# Patient Record
Sex: Female | Born: 1995 | Race: White | Hispanic: No | Marital: Single | Smoking: Never smoker
Health system: Southern US, Community
[De-identification: ages and names within clinical notes are randomized; demographics above are authoritative.]

---
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
## 2016-02-15 DIAGNOSIS — F411 Generalized anxiety disorder: Secondary | ICD-10-CM | POA: Insufficient documentation

## 2016-02-15 DIAGNOSIS — N941 Unspecified dyspareunia: Secondary | ICD-10-CM | POA: Insufficient documentation

## 2016-03-28 DIAGNOSIS — R1013 Epigastric pain: Secondary | ICD-10-CM | POA: Insufficient documentation

## 2016-04-26 DIAGNOSIS — N94819 Vulvodynia, unspecified: Secondary | ICD-10-CM | POA: Insufficient documentation

## 2016-04-26 DIAGNOSIS — M6289 Other specified disorders of muscle: Secondary | ICD-10-CM | POA: Insufficient documentation

## 2016-12-30 DIAGNOSIS — S76011A Strain of muscle, fascia and tendon of right hip, initial encounter: Secondary | ICD-10-CM | POA: Insufficient documentation

## 2017-04-07 ENCOUNTER — Emergency Department
Admission: EM | Admit: 2017-04-07 | Discharge: 2017-04-07 | Disposition: A | Discharge status: Home / Self Care | Payer: BLUE CROSS/BLUE SHIELD | Attending: Emergency Medicine | Admitting: Emergency Medicine

## 2017-04-07 ENCOUNTER — Encounter: Payer: Self-pay | Admitting: Emergency Medicine

## 2017-04-07 DIAGNOSIS — M62830 Muscle spasm of back: Secondary | ICD-10-CM | POA: Diagnosis not present

## 2017-04-07 DIAGNOSIS — M549 Dorsalgia, unspecified: Secondary | ICD-10-CM | POA: Diagnosis present

## 2017-04-07 MED ORDER — ORPHENADRINE CITRATE 30 MG/ML IJ SOLN
60.0000 mg | Freq: Once | INTRAMUSCULAR | Status: AC
  Administered 2017-04-07: 60 mg via INTRAMUSCULAR
  Filled 2017-04-07: qty 2

## 2017-04-07 MED ORDER — KETOROLAC TROMETHAMINE 30 MG/ML IJ SOLN
30.0000 mg | Freq: Once | INTRAMUSCULAR | Status: AC
  Administered 2017-04-07: 30 mg via INTRAMUSCULAR
  Filled 2017-04-07: qty 1

## 2017-04-07 MED ORDER — METHOCARBAMOL 500 MG PO TABS: 500.0000 mg | ORAL_TABLET | Freq: Four times a day (QID) | ORAL | 0 refills | Status: DC

## 2017-04-07 MED ORDER — MELOXICAM 15 MG PO TABS: 15.0000 mg | ORAL_TABLET | Freq: Every day | ORAL | 0 refills | Status: DC

## 2017-04-07 NOTE — ED Triage Notes (Signed)
Pt c/o upper back pain mostly on right side.   Started a few hours ago.  Has had back problems in past.  Reports pain is worse with moving, mom had to help put clothes on. Respirations unlabored. Ambulatory to triage without difficulty.

## 2017-04-07 NOTE — ED Provider Notes (Signed)
[facility] Emergency Department Provider Note  ____________________________________________  Time seen: Approximately 5:52 PM  I have reviewed the triage vital signs and the nursing notes.   HISTORY  Chief Complaint Back Pain    HPI ASHLLEY [facility] is a 21 y.o. female who presents emergency department complaining of right upper back pain. Patient reports that she got out of the shower, way down when she felt a sharp pain to the right upper back. Patient reports that is a tight/pulling/burning sensation to the right upper back and shoulder region. No injury or trauma. Patient denies any radicular symptoms. Patient denies any chest pain, shortness of breath, abdominal pain, nausea or vomiting. No medications prior to arrival.   History reviewed. No pertinent past medical history.  There are no active problems to display for this patient.   History reviewed. No pertinent surgical history.  Prior to Admission medications   Medication Sig Start Date End Date Taking? Authorizing Provider  meloxicam (MOBIC) 15 MG tablet Take 1 tablet (15 mg total) by mouth daily. 04/07/17   Cuthriell, Delorise Royals, PA-C  methocarbamol (ROBAXIN) 500 MG tablet Take 1 tablet (500 mg total) by mouth 4 (four) times daily. 04/07/17   Cuthriell, Delorise Royals, PA-C    Allergies Amoxicillin  History reviewed. No pertinent family history.  Social History Social History  Substance Use Topics  . Smoking status: Never Smoker  . Smokeless tobacco: Never Used  . Alcohol use No     Review of Systems  Constitutional: No fever/chills Cardiovascular: no chest pain. Respiratory: no cough. No SOB. Musculoskeletal: Positive for right upper back pain Skin: Negative for rash, abrasions, lacerations, ecchymosis. Neurological: Negative for headaches, focal weakness or numbness. 10-point ROS otherwise negative.  ____________________________________________   PHYSICAL EXAM:  VITAL  SIGNS: ED Triage Vitals  Enc Vitals Group     BP 04/07/17 1611 (!) 148/95     Pulse Rate 04/07/17 1610 84     Resp 04/07/17 1610 14     Temp --      Temp src --      SpO2 04/07/17 1610 99 %     Weight 04/07/17 1610 120 lb (54.4 kg)     Height 04/07/17 1610 5\' 1"  (1.549 m)     Head Circumference --      Peak Flow --      Pain Score 04/07/17 1610 10     Pain Loc --      Pain Edu? --      Excl. in [place]? --      Constitutional: Alert and oriented. Well appearing and in no acute distress. Eyes: Conjunctivae are normal. PERRL. EOMI. Head: Atraumatic. Neck: No stridor.  No cervical spine tenderness to palpation.  Cardiovascular: Normal rate, regular rhythm. Normal S1 and S2.  Good peripheral circulation. Respiratory: Normal respiratory effort without tachypnea or retractions. Lungs CTAB. Good air entry to the bases with no decreased or absent breath sounds. Gastrointestinal: Bowel sounds 4 quadrants. Soft and nontender to palpation. No guarding or rigidity. No palpable masses. No distention. No CVA tenderness. Musculoskeletal: Full range of motion to all extremities. No gross deformities appreciated. Patient is diffusely tender to palpation over the right-sided latissimus dorsi muscle with palpable spasms. No other tenderness to palpation. No palpable abnormality. Full range of motion bilateral shoulders. Radial pulse intact bilateral upper extremities. Sensation intact and equal bilateral upper extremities. Neurologic:  Normal speech and language. No gross focal neurologic deficits are appreciated.  Skin:  Skin is  warm, dry and intact. No rash noted. Psychiatric: Mood and affect are normal. Speech and behavior are normal. Patient exhibits appropriate insight and judgement.   ____________________________________________   LABS (all labs ordered are listed, but only abnormal results are displayed)  Labs Reviewed - No data to  display ____________________________________________  EKG   ____________________________________________  RADIOLOGY   No results found.  ____________________________________________    PROCEDURES  Procedure(s) performed:    Procedures    Medications  ketorolac (TORADOL) 30 MG/ML injection 30 mg (not administered)  orphenadrine (NORFLEX) injection 60 mg (not administered)     ____________________________________________   INITIAL IMPRESSION / ASSESSMENT AND PLAN / ED COURSE  Pertinent labs & imaging results that were available during my care of the patient were reviewed by me and considered in my medical decision making (see chart for details).  Review of the [place] CSRS was performed in accordance of the [facility] prior to dispensing any controlled drugs.     Patient's diagnosis is consistent with muscle spasm of the upper back. No injury concerning for underlying fracture. No imaging or labs deemed necessary at this time. Patient is given Toradol muscle relaxer injection in the emergency department.. Patient will be discharged home with prescriptions for anti-inflammatory and muscle relaxer. Patient is to follow up with primary care as needed or otherwise directed. Patient is given ED precautions to return to the ED for any worsening or new symptoms.     ____________________________________________  FINAL CLINICAL IMPRESSION(S) / ED DIAGNOSES  Final diagnoses:  Muscle spasm of back      NEW MEDICATIONS STARTED DURING THIS VISIT:  New Prescriptions   MELOXICAM (MOBIC) 15 MG TABLET    Take 1 tablet (15 mg total) by mouth daily.   METHOCARBAMOL (ROBAXIN) 500 MG TABLET    Take 1 tablet (500 mg total) by mouth 4 (four) times daily.        This chart was dictated using voice recognition software/Dragon. Despite best efforts to proofread, errors can occur which can change the meaning. Any change was purely unintentional.    Racheal Patches,  PA-C 04/07/17 1817    Governor Rooks, MD 04/08/17 1524

## 2017-04-07 NOTE — ED Notes (Signed)
No signs or symptoms of medication reaction.

## 2017-12-24 DIAGNOSIS — J302 Other seasonal allergic rhinitis: Secondary | ICD-10-CM | POA: Insufficient documentation

## 2018-09-15 DIAGNOSIS — Z111 Encounter for screening for respiratory tuberculosis: Secondary | ICD-10-CM | POA: Insufficient documentation

## 2019-01-31 DIAGNOSIS — R030 Elevated blood-pressure reading, without diagnosis of hypertension: Secondary | ICD-10-CM | POA: Insufficient documentation

## 2019-02-10 DIAGNOSIS — R000 Tachycardia, unspecified: Secondary | ICD-10-CM | POA: Insufficient documentation

## 2019-06-09 ENCOUNTER — Emergency Department
Admission: EM | Admit: 2019-06-09 | Discharge: 2019-06-09 | Disposition: A | Discharge status: Home / Self Care | Payer: BC Managed Care – PPO | Attending: Emergency Medicine | Admitting: Emergency Medicine

## 2019-06-09 ENCOUNTER — Other Ambulatory Visit: Payer: Self-pay

## 2019-06-09 DIAGNOSIS — R112 Nausea with vomiting, unspecified: Secondary | ICD-10-CM

## 2019-06-09 LAB — COMPREHENSIVE METABOLIC PANEL
ALT: 10 U/L (ref 0–44)
AST: 17 U/L (ref 15–41)
Albumin: 3.8 g/dL (ref 3.5–5.0)
Alkaline Phosphatase: 53 U/L (ref 38–126)
Anion gap: 8 (ref 5–15)
BUN: 10 mg/dL (ref 6–20)
CO2: 25 mmol/L (ref 22–32)
Calcium: 9.1 mg/dL (ref 8.9–10.3)
Chloride: 104 mmol/L (ref 98–111)
Creatinine, Ser: 0.8 mg/dL (ref 0.44–1.00)
GFR calc Af Amer: >60 mL/min (ref >60)
GFR calc non Af Amer: >60 mL/min (ref >60)
Glucose, Bld: 106 mg/dL — ABNORMAL HIGH (ref 70–99)
Potassium: 4.2 mmol/L (ref 3.5–5.1)
Sodium: 137 mmol/L (ref 135–145)
Total Bilirubin: 0.4 mg/dL (ref 0.3–1.2)
Total Protein: 7.3 g/dL (ref 6.5–8.1)

## 2019-06-09 LAB — CBC
HCT: 40 % (ref 36.0–46.0)
Hemoglobin: 13.5 g/dL (ref 12.0–15.0)
MCH: 27 pg (ref 26.0–34.0)
MCHC: 33.8 g/dL (ref 30.0–36.0)
MCV: 80 fL (ref 80.0–100.0)
Platelets: 299 10*3/uL (ref 150–400)
RBC: 5 MIL/uL (ref 3.87–5.11)
RDW: 13.6 % (ref 11.5–15.5)
WBC: 9.5 10*3/uL (ref 4.0–10.5)
nRBC: 0 % (ref 0.0–0.2)

## 2019-06-09 LAB — HCG, QUANTITATIVE, PREGNANCY: hCG, Beta Chain, Quant, S: <1 m[IU]/mL (ref <5)

## 2019-06-09 LAB — LIPASE, BLOOD: Lipase: 22 U/L (ref 11–51)

## 2019-06-09 MED ORDER — SODIUM CHLORIDE 0.9 % IV BOLUS
1000.0000 mL | Freq: Once | INTRAVENOUS | Status: AC
  Administered 2019-06-09: 1000 mL via INTRAVENOUS

## 2019-06-09 MED ORDER — PROMETHAZINE HCL 25 MG PO TABS: 25.0000 mg | ORAL_TABLET | Freq: Four times a day (QID) | ORAL | 0 refills | Status: DC | PRN

## 2019-06-09 MED ORDER — ONDANSETRON HCL 4 MG/2ML IJ SOLN
4.0000 mg | Freq: Once | INTRAMUSCULAR | Status: AC
  Administered 2019-06-09: 07:00:00 4 mg via INTRAVENOUS
  Filled 2019-06-09: qty 2

## 2019-06-09 MED ORDER — PROCHLORPERAZINE EDISYLATE 10 MG/2ML IJ SOLN
10.0000 mg | Freq: Once | INTRAMUSCULAR | Status: AC
  Administered 2019-06-09: 10 mg via INTRAVENOUS
  Filled 2019-06-09: qty 2

## 2019-06-09 MED ORDER — DICYCLOMINE HCL 10 MG PO CAPS
20.0000 mg | ORAL_CAPSULE | Freq: Once | ORAL | Status: AC
  Administered 2019-06-09: 20 mg via ORAL
  Filled 2019-06-09: qty 2

## 2019-06-09 NOTE — ED Notes (Signed)
Pt laying across chairs covered up completely with blanket; awake; answered when I went over to see if she was okay; waiting patiently for treatment room

## 2019-06-09 NOTE — ED Triage Notes (Signed)
Patient reports nausea and vomiting for approximately a week.  Patient also reports having high blood pressure.

## 2019-06-09 NOTE — ED Provider Notes (Signed)
-----------------------------------------   12:08 PM on 06/09/2019 -----------------------------------------  Patient care assumed from Dr. Archie Balboa.  Patient's nausea is better controlled at this time.  No further vomiting.  Has drinking water and ginger ale.  We will discharge home with Phenergan, have the patient follow-up with her doctor.  I discussed return precautions with the patient.   Harvest Dark, MD 06/09/19 1208

## 2019-06-09 NOTE — ED Provider Notes (Signed)
[facility] Emergency Department Provider Note  ____________________________________________   I have reviewed the triage vital signs and the nursing notes.   HISTORY  Chief Complaint Emesis   History limited by: Not Limited   HPI [facility] is a 23 y.o. female who presents to the emergency department today because of concerns for nausea and emesis.  The symptoms have been present for the past week.  She denies any bloody vomiting.  Denies any associated abdominal pain or diarrhea.  Patient had similar symptoms in the past states that she was at one point treated for H. pylori.  Records reviewed. Per medical record review patient has a history of white coat syndrome  No past medical history on file.  There are no active problems to display for this patient.   No past surgical history on file.  Prior to Admission medications   Medication Sig Start Date End Date Taking? Authorizing Provider  meloxicam (MOBIC) 15 MG tablet Take 1 tablet (15 mg total) by mouth daily. 04/07/17   Cuthriell, Delorise Royals, PA-C  methocarbamol (ROBAXIN) 500 MG tablet Take 1 tablet (500 mg total) by mouth 4 (four) times daily. 04/07/17   Cuthriell, Delorise Royals, PA-C    Allergies Amoxicillin  No family history on file.  Social History Social History   Tobacco Use  . Smoking status: Never Smoker  . Smokeless tobacco: Never Used  Substance Use Topics  . Alcohol use: No  . Drug use: No    Review of Systems Constitutional: No fever/chills Eyes: No visual changes. ENT: No sore throat. Cardiovascular: Denies chest pain. Respiratory: Denies shortness of breath. Gastrointestinal: No abdominal pain.  Positive for nausea and vomiting.  Genitourinary: Negative for dysuria. Musculoskeletal: Negative for back pain. Skin: Negative for rash. Neurological: Negative for headaches, focal weakness or numbness.  ____________________________________________   PHYSICAL  EXAM:  VITAL SIGNS: ED Triage Vitals  Enc Vitals Group     BP 06/09/19 0230 (!) 152/103     Pulse Rate 06/09/19 0230 73     Resp 06/09/19 0230 16     Temp 06/09/19 0230 98.3 F (36.8 C)     Temp Source 06/09/19 0230 Oral     SpO2 06/09/19 0230 99 %     Weight 06/09/19 0229 133 lb (60.3 kg)     Height 06/09/19 0229 5\' 1"  (1.549 m)     Head Circumference --      Peak Flow --      Pain Score 06/09/19 0229 0   Constitutional: Alert and oriented.  Eyes: Conjunctivae are normal.  ENT      Head: Normocephalic and atraumatic.      Nose: No congestion/rhinnorhea.      Mouth/Throat: Mucous membranes are moist.      Neck: No stridor. Hematological/Lymphatic/Immunilogical: No cervical lymphadenopathy. Cardiovascular: Normal rate, regular rhythm.  No murmurs, rubs, or gallops.  Respiratory: Normal respiratory effort without tachypnea nor retractions. Breath sounds are clear and equal bilaterally. No wheezes/rales/rhonchi. Gastrointestinal: Soft and non tender. No rebound. No guarding.  Genitourinary: Deferred Musculoskeletal: Normal range of motion in all extremities. No lower extremity edema. Neurologic:  Normal speech and language. No gross focal neurologic deficits are appreciated.  Skin:  Skin is warm, dry and intact. No rash noted. Psychiatric: Mood and affect are normal. Speech and behavior are normal. Patient exhibits appropriate insight and judgment.  ____________________________________________    LABS (pertinent positives/negatives)  Lipase 22 CBC wbc 9.5, hgb 13.5, plt 299 CMP wnl except  glu 106  ____________________________________________   EKG  None  ____________________________________________    RADIOLOGY  None  ____________________________________________   PROCEDURES  Procedures  ____________________________________________   INITIAL IMPRESSION / ASSESSMENT AND PLAN / ED COURSE  Pertinent labs & imaging results that were available during my  care of the patient were reviewed by me and considered in my medical decision making (see chart for details).   Patient presented to the emergency department because of concern for nausea and vomiting. Patients blood work without concerning electrolyte abnormality. Will give IV fluids and nausea medication and reassess.   ____________________________________________   FINAL CLINICAL IMPRESSION(S) / ED DIAGNOSES  Final diagnoses:  Nausea and vomiting, intractability of vomiting not specified, unspecified vomiting type     Note: This dictation was prepared with Dragon dictation. Any transcriptional errors that result from this process are unintentional     Nance Pear, MD 06/09/19 [phone]

## 2019-06-09 NOTE — ED Notes (Signed)
Resumed care from [place], [place]. Pt states that she is still nauseous; denies pain. Pt requests crackers. Will check with MD regarding PO challenge; 800 cc NS infused at this time. NAD noted.

## 2020-08-21 HISTORY — PX: GALLBLADDER SURGERY: SHX652

## 2021-08-26 DIAGNOSIS — I160 Hypertensive urgency: Secondary | ICD-10-CM | POA: Insufficient documentation

## 2022-05-26 ENCOUNTER — Emergency Department: Payer: BC Managed Care – PPO

## 2022-05-26 ENCOUNTER — Emergency Department
Admission: EM | Admit: 2022-05-26 | Discharge: 2022-05-27 | Disposition: A | Discharge status: Home / Self Care | Payer: BC Managed Care – PPO | Attending: Emergency Medicine | Admitting: Emergency Medicine

## 2022-05-26 ENCOUNTER — Encounter: Payer: Self-pay | Admitting: Emergency Medicine

## 2022-05-26 DIAGNOSIS — F10920 Alcohol use, unspecified with intoxication, uncomplicated: Secondary | ICD-10-CM | POA: Insufficient documentation

## 2022-05-26 DIAGNOSIS — E876 Hypokalemia: Secondary | ICD-10-CM | POA: Diagnosis not present

## 2022-05-26 DIAGNOSIS — Y908 Blood alcohol level of 240 mg/100 ml or more: Secondary | ICD-10-CM | POA: Diagnosis not present

## 2022-05-26 DIAGNOSIS — I10 Essential (primary) hypertension: Secondary | ICD-10-CM | POA: Diagnosis not present

## 2022-05-26 DIAGNOSIS — R569 Unspecified convulsions: Secondary | ICD-10-CM | POA: Diagnosis present

## 2022-05-26 LAB — COMPREHENSIVE METABOLIC PANEL
ALT: 18 U/L (ref 0–44)
AST: 25 U/L (ref 15–41)
Albumin: 4.2 g/dL (ref 3.5–5.0)
Alkaline Phosphatase: 93 U/L (ref 38–126)
Anion gap: 9 (ref 5–15)
BUN: 11 mg/dL (ref 6–20)
CO2: 24 mmol/L (ref 22–32)
Calcium: 8.8 mg/dL — ABNORMAL LOW (ref 8.9–10.3)
Chloride: 106 mmol/L (ref 98–111)
Creatinine, Ser: 1.1 mg/dL — ABNORMAL HIGH (ref 0.44–1.00)
GFR, Estimated: >60 mL/min (ref >60)
Glucose, Bld: 88 mg/dL (ref 70–99)
Potassium: 3.1 mmol/L — ABNORMAL LOW (ref 3.5–5.1)
Sodium: 139 mmol/L (ref 135–145)
Total Bilirubin: 0.4 mg/dL (ref 0.3–1.2)
Total Protein: 7.7 g/dL (ref 6.5–8.1)

## 2022-05-26 LAB — CBC
HCT: 38.4 % (ref 36.0–46.0)
Hemoglobin: 12.7 g/dL (ref 12.0–15.0)
MCH: 25.4 pg — ABNORMAL LOW (ref 26.0–34.0)
MCHC: 33.1 g/dL (ref 30.0–36.0)
MCV: 76.8 fL — ABNORMAL LOW (ref 80.0–100.0)
Platelets: 319 10*3/uL (ref 150–400)
RBC: 5 MIL/uL (ref 3.87–5.11)
RDW: 13.5 % (ref 11.5–15.5)
WBC: 10.2 10*3/uL (ref 4.0–10.5)
nRBC: 0 % (ref 0.0–0.2)

## 2022-05-26 MED ORDER — SODIUM CHLORIDE 0.9 % IV BOLUS
1000.0000 mL | Freq: Once | INTRAVENOUS | Status: AC
  Administered 2022-05-26: 1000 mL via INTRAVENOUS

## 2022-05-26 NOTE — ED Provider Notes (Signed)
[facility] Provider Note    Event Date/Time   First MD Initiated Contact with Patient 05/26/22 2307     (approximate)   History   Alcohol Intoxication   HPI  Level V caveat: Limited by decreased LOC  [facility] is a 26 y.o. female brought to the ED via EMS from [facility] for witnessed seizure-like activity by bystanders in the bathroom.  Reportedly on scene patient admitted to fentanyl use along with heavy amount of alcohol.  Rest of history is limited secondary to patient's decreased LOC.     Past Medical History  Hypertension Sensorineural hearing loss   Active Problem List  There are no problems to display for this patient.    Past Surgical History  History reviewed. No pertinent surgical history.   Home Medications   Prior to Admission medications   Medication Sig Start Date End Date Taking? Authorizing Provider  meloxicam (MOBIC) 15 MG tablet Take 1 tablet (15 mg total) by mouth daily. 04/07/17   Cuthriell, Charline Bills, PA-C  methocarbamol (ROBAXIN) 500 MG tablet Take 1 tablet (500 mg total) by mouth 4 (four) times daily. 04/07/17   Cuthriell, Charline Bills, PA-C  promethazine (PHENERGAN) 25 MG tablet Take 1 tablet (25 mg total) by mouth every 6 (six) hours as needed for nausea or vomiting. 06/09/19   Harvest Dark, MD     Allergies  Amoxicillin   Family History  History reviewed. No pertinent family history.   Physical Exam  Triage Vital Signs: ED Triage Vitals  Enc Vitals Group     BP 05/26/22 2254 102/66     Pulse Rate 05/26/22 2254 60     Resp 05/26/22 2254 16     Temp 05/26/22 2257 98 F (36.7 C)     Temp Source 05/26/22 2257 Axillary     SpO2 05/26/22 2254 100 %     Weight 05/26/22 2255 136 lb 11 oz (62 kg)     Height 05/26/22 2255 5\' 1"  (1.549 m)     Head Circumference --      Peak Flow --      Pain Score --      Pain Loc --      Pain Edu? --      Excl. in [place]? --     Updated Vital Signs: BP (!)  98/50   Pulse 76   Temp 98 F (36.7 C) (Axillary)   Resp 18   Ht 5\' 1"  (1.549 m)   Wt 62 kg   LMP  (LMP Unknown)   SpO2 100%   BMI 25.83 kg/m    General: Asleep, no distress.  CV:  RRR.  Good peripheral perfusion.  Resp:  Normal effort.  CTA B. Abd:  Nontender.  No distention.  Other:  Head is atraumatic.  Nose is atraumatic.  No dental malocclusion.  PERRL.  EOMI.  Somnolent and difficult to arouse.   ED Results / Procedures / Treatments  Labs (all labs ordered are listed, but only abnormal results are displayed) Labs Reviewed  CBC - Abnormal; Notable for the following components:      Result Value   MCV 76.8 (*)    MCH 25.4 (*)    All other components within normal limits  COMPREHENSIVE METABOLIC PANEL - Abnormal; Notable for the following components:   Potassium 3.1 (*)    Creatinine, Ser 1.10 (*)    Calcium 8.8 (*)    All other components within normal limits  ETHANOL - Abnormal; Notable for the following components:   Alcohol, Ethyl (B) 278 (*)    All other components within normal limits  ACETAMINOPHEN LEVEL - Abnormal; Notable for the following components:   Acetaminophen (Tylenol), Serum <10 (*)    All other components within normal limits  SALICYLATE LEVEL - Abnormal; Notable for the following components:   Salicylate Lvl [id number] (*)    All other components within normal limits  URINALYSIS, ROUTINE W REFLEX MICROSCOPIC - Abnormal; Notable for the following components:   Color, Urine COLORLESS (*)    APPearance CLEAR (*)    Specific Gravity, Urine 1.001 (*)    All other components within normal limits  URINE DRUG SCREEN, QUALITATIVE ([facility] ONLY)  PREGNANCY, URINE  POC URINE PREG, ED     EKG  ED ECG REPORT I, Journie Howson J, the attending physician, personally viewed and interpreted this ECG.   Date: 05/26/2022  EKG Time: 2255  Rate: 57  Rhythm: sinus bradycardia  Axis: Normal  Intervals:none  ST&T Change: Nonspecific    RADIOLOGY I have  independently visualized and interpreted patient's CT and chest x-ray as well as noted the radiology interpretation:  CT head: No ICH  Chest x-ray:?  Hazy density left lower hemithorax favoring overlying soft tissue although aspiration not excluded   Official radiology report(s): CT Head Wo Contrast  Result Date: 05/26/2022 CLINICAL DATA:  Witnessed seizure-like activity. EXAM: CT HEAD WITHOUT CONTRAST TECHNIQUE: Contiguous axial images were obtained from the base of the skull through the vertex without intravenous contrast. RADIATION DOSE REDUCTION: This exam was performed according to the departmental dose-optimization program which includes automated exposure control, adjustment of the mA and/or kV according to patient size and/or use of iterative reconstruction technique. COMPARISON:  None Available. FINDINGS: Brain: No evidence of acute infarction, hemorrhage, hydrocephalus, extra-axial collection or mass lesion/mass effect. Vascular: No hyperdense vessel or unexpected calcification. Skull: Negative for fractures or focal lesions. Sinuses/Orbits: Unremarkable visualized orbital contents. There is patchy membrane thickening in the ethmoid air cells. Other visible sinuses and visualized mastoid air cells are clear with bilateral incidental note of petrous apex pneumatization. Other: None. IMPRESSION: 1. No acute intracranial CT findings. 2. Ethmoid sinus membrane disease. Electronically Signed   By: Telford Nab M.D.   On: 05/26/2022 23:39   DG Chest Port 1 View  Result Date: 05/26/2022 CLINICAL DATA:  Seizure-like activity, fentanyl use, ETOH EXAM: PORTABLE CHEST 1 VIEW COMPARISON:  05/06/2011 FINDINGS: Patient is rotated. Hazy increased density overlying the left mid/lower hemithorax. This may reflect overlying soft tissue, although aspiration is not excluded. Right lung is clear. No definite pleural effusions. No pneumothorax. The heart is normal in size. IMPRESSION: Hazy increased density  overlying the left mid/lower hemithorax, favoring overlying soft tissue, although aspiration is not excluded. Repeat frontal radiograph (with better patient positioning) or ideally PA/lateral chest radiographs is suggested when patient is clinically able. Electronically Signed   By: Julian Hy M.D.   On: 05/26/2022 23:27     PROCEDURES:  Critical Care performed: Yes, see critical care procedure note(s)  CRITICAL CARE Performed by: Paulette Blanch   Total critical care time: 45 minutes  Critical care time was exclusive of separately billable procedures and treating other patients.  Critical care was necessary to treat or prevent imminent or life-threatening deterioration.  Critical care was time spent personally by me on the following activities: development of treatment plan with patient and/or surrogate as well as nursing, discussions with consultants, evaluation of patient's response to  treatment, examination of patient, obtaining history from patient or surrogate, ordering and performing treatments and interventions, ordering and review of laboratory studies, ordering and review of radiographic studies, pulse oximetry and re-evaluation of patient's condition.   Marland Kitchen1-3 Lead EKG Interpretation  Performed by: Paulette Blanch, MD Authorized by: Paulette Blanch, MD     Interpretation: abnormal     ECG rate:  57   Rhythm: sinus bradycardia     Ectopy: none     Conduction: normal   Comments:     Patient placed on cardiac monitor to evaluate for arrhythmias    MEDICATIONS ORDERED IN ED: Medications  sodium chloride 0.9 % bolus 1,000 mL (0 mLs Intravenous Stopped 05/27/22 0117)  haloperidol lactate (HALDOL) injection 5 mg (5 mg Intramuscular Given 05/27/22 0005)  LORazepam (ATIVAN) injection 2 mg (2 mg Intramuscular Given 05/27/22 0005)  diphenhydrAMINE (BENADRYL) injection 50 mg (50 mg Intramuscular Given 05/27/22 0005)  sodium chloride 0.9 % bolus 1,000 mL (0 mLs Intravenous Stopped  05/27/22 0503)     IMPRESSION / MDM / ASSESSMENT AND PLAN / ED COURSE  I reviewed the triage vital signs and the nursing notes.                             26 year old female brought for seizure-like activity and alcohol intoxication. Differential diagnosis includes, but is not limited to, alcohol, illicit or prescription medications, or other toxic ingestion; intracranial pathology such as stroke or intracerebral hemorrhage; fever or infectious causes including sepsis; hypoxemia and/or hypercarbia; uremia; trauma; endocrine related disorders such as diabetes, hypoglycemia, and thyroid-related diseases; hypertensive encephalopathy; etc. I have personally reviewed patient's records and note a PCP physical exam on 03/02/2022.  Patient's presentation is most consistent with acute presentation with potential threat to life or bodily function.  The patient is on the cardiac monitor to evaluate for evidence of arrhythmia and/or significant heart rate changes.  We will obtain toxicological lab work and urine, obtain CT head, chest x-ray.  Will reassess.  Clinical Course as of 05/27/22 7989  Sat May 27, 2022  0022 Patient's mother at bedside, requesting assistance.  Patient awoke and ripped out her IV, appears fearful and paranoid "they are going to kill me".  Does not want her mother to touch her "they will kill you if you touch me".  Patient stumbling around the room, crashing into the walls.  IM calming agent needed to prevent patient from harming herself as she is not verbally redirectable. IM calming agent successful; patient now sleeping comfortable in no acute distress.  Chest x-ray demonstrates left-sided haziness which is likely overlying soft tissue.  Doubt aspiration as patient is not tachypneic nor hypoxic. [JS]  0126 EtOH elevated at 278; UDS unremarkable.  Updated mother who remains at bedside.  We will continue to monitor and care for patient. [JS]  [id number] Patient sleeping in no acute  distress.  Mother remains at bedside.  We will continue to monitor and care for patient. [JS]  2119 Got up to use the restroom with assistance.  Resting comfortably.  Mother at bedside.  We will continue to monitor and care for patient until she is more steady on her feet. [JS]  [id number] Patient remains soundly sleeping.  Mother remains at bedside.  Anticipate discharge home once patient is awake and ambulatory with steady gait.  Will be transferred to the oncoming provider Dr. Jari Pigg at change of shift. [JS]    Clinical  Course User Index [JS] Paulette Blanch, MD     FINAL CLINICAL IMPRESSION(S) / ED DIAGNOSES   Final diagnoses:  Alcoholic intoxication without complication ([place])  Hypokalemia     Rx / DC Orders   ED Discharge Orders     None        Note:  This document was prepared using Dragon voice recognition software and may include unintentional dictation errors.   Paulette Blanch, MD 05/27/22 [phone]

## 2022-05-26 NOTE — ED Provider Notes (Incomplete)
[facility] Provider Note    Event Date/Time   First MD Initiated Contact with Patient 05/26/22 2307     (approximate)   History   Alcohol Intoxication   HPI  Level V caveat: Limited by decreased LOC  Sara Johnston is a 26 y.o. female brought to the ED via EMS from [facility] for witnessed seizure-like activity by bystanders in the bathroom.  Reportedly on scene patient admitted to fentanyl use along with heavy amount of alcohol.  Rest of history is limited secondary to patient's decreased LOC.     Past Medical History  Hypertension Sensorineural hearing loss   Active Problem List  There are no problems to display for this patient.    Past Surgical History  History reviewed. No pertinent surgical history.   Home Medications   Prior to Admission medications   Medication Sig Start Date End Date Taking? Authorizing Provider  meloxicam (MOBIC) 15 MG tablet Take 1 tablet (15 mg total) by mouth daily. 04/07/17   Cuthriell, Charline Bills, PA-C  methocarbamol (ROBAXIN) 500 MG tablet Take 1 tablet (500 mg total) by mouth 4 (four) times daily. 04/07/17   Cuthriell, Charline Bills, PA-C  promethazine (PHENERGAN) 25 MG tablet Take 1 tablet (25 mg total) by mouth every 6 (six) hours as needed for nausea or vomiting. 06/09/19   Harvest Dark, MD     Allergies  Amoxicillin   Family History  History reviewed. No pertinent family history.   Physical Exam  Triage Vital Signs: ED Triage Vitals  Enc Vitals Group     BP 05/26/22 2254 102/66     Pulse Rate 05/26/22 2254 60     Resp 05/26/22 2254 16     Temp 05/26/22 2257 98 F (36.7 C)     Temp Source 05/26/22 2257 Axillary     SpO2 05/26/22 2254 100 %     Weight 05/26/22 2255 136 lb 11 oz (62 kg)     Height 05/26/22 2255 5\' 1"  (1.549 m)     Head Circumference --      Peak Flow --      Pain Score --      Pain Loc --      Pain Edu? --      Excl. in [place]? --     Updated Vital Signs: BP 102/66    Pulse 60   Temp 98 F (36.7 C) (Axillary)   Resp 16   Ht 5\' 1"  (1.549 m)   Wt 62 kg   LMP  (LMP Unknown)   SpO2 100%   BMI 25.83 kg/m    General: Asleep, no distress.  CV:  RRR.  Good peripheral perfusion.  Resp:  Normal effort.  CTA B. Abd:  Nontender.  No distention.  Other:  Head is atraumatic.  Nose is atraumatic.  No dental malocclusion.  PERRL.  EOMI.  Somnolent and difficult to arouse.   ED Results / Procedures / Treatments  Labs (all labs ordered are listed, but only abnormal results are displayed) Labs Reviewed  CBC - Abnormal; Notable for the following components:      Result Value   MCV 76.8 (*)    MCH 25.4 (*)    All other components within normal limits  COMPREHENSIVE METABOLIC PANEL  ETHANOL  ACETAMINOPHEN LEVEL  SALICYLATE LEVEL     EKG  ED ECG REPORT I, Ranjit Ashurst J, the attending physician, personally viewed and interpreted this ECG.   Date: 05/26/2022  EKG Time:  2255  Rate: 57  Rhythm: sinus bradycardia  Axis: Normal  Intervals:none  ST&T Change: Nonspecific    RADIOLOGY I have independently visualized and interpreted patient's CT and chest x-ray as well as noted the radiology interpretation:  CT head:  Chest x-ray:  Official radiology report(s): DG Chest Port 1 View  Result Date: 05/26/2022 CLINICAL DATA:  Seizure-like activity, fentanyl use, ETOH EXAM: PORTABLE CHEST 1 VIEW COMPARISON:  05/06/2011 FINDINGS: Patient is rotated. Hazy increased density overlying the left mid/lower hemithorax. This may reflect overlying soft tissue, although aspiration is not excluded. Right lung is clear. No definite pleural effusions. No pneumothorax. The heart is normal in size. IMPRESSION: Hazy increased density overlying the left mid/lower hemithorax, favoring overlying soft tissue, although aspiration is not excluded. Repeat frontal radiograph (with better patient positioning) or ideally PA/lateral chest radiographs is suggested when patient is  clinically able. Electronically Signed   By: Julian Hy M.D.   On: 05/26/2022 23:27     PROCEDURES:  Critical Care performed: {CriticalCareYesNo:19197::"Yes, see critical care procedure note(s)","No"}  .1-3 Lead EKG Interpretation  Performed by: Paulette Blanch, MD Authorized by: Paulette Blanch, MD     Interpretation: abnormal     ECG rate:  57   Rhythm: sinus bradycardia     Ectopy: none     Conduction: normal   Comments:     Patient placed on cardiac monitor to evaluate for arrhythmias    MEDICATIONS ORDERED IN ED: Medications  sodium chloride 0.9 % bolus 1,000 mL (1,000 mLs Intravenous New Bag/Given 05/26/22 2316)     IMPRESSION / MDM / [place] / ED COURSE  I reviewed the triage vital signs and the nursing notes.                             26 year old female brought for seizure-like activity and alcohol intoxication. Differential diagnosis includes, but is not limited to, alcohol, illicit or prescription medications, or other toxic ingestion; intracranial pathology such as stroke or intracerebral hemorrhage; fever or infectious causes including sepsis; hypoxemia and/or hypercarbia; uremia; trauma; endocrine related disorders such as diabetes, hypoglycemia, and thyroid-related diseases; hypertensive encephalopathy; etc. I have personally reviewed patient's records and note a PCP physical exam on 03/02/2022.  Patient's presentation is most consistent with acute presentation with potential threat to life or bodily function.  The patient is on the cardiac monitor to evaluate for evidence of arrhythmia and/or significant heart rate changes.  We will obtain toxicological lab work and urine, obtain CT head, chest x-ray.  Will reassess.      FINAL CLINICAL IMPRESSION(S) / ED DIAGNOSES   Final diagnoses:  None     Rx / DC Orders   ED Discharge Orders     None        Note:  This document was prepared using Dragon voice recognition software and may  include unintentional dictation errors.

## 2022-05-26 NOTE — ED Triage Notes (Signed)
Pt to ED via [facility] from [facility], per EMS pt had witnessed seizure like activity by bystanders in the bathroom. Per EMS on scene patient admitted to fentanyl use along with copious amounts of ethanol.

## 2022-05-27 LAB — PREGNANCY, URINE: Preg Test, Ur: NEGATIVE

## 2022-05-27 LAB — URINALYSIS, ROUTINE W REFLEX MICROSCOPIC
Bilirubin Urine: NEGATIVE
Glucose, UA: NEGATIVE mg/dL
Hgb urine dipstick: NEGATIVE
Ketones, ur: NEGATIVE mg/dL
Leukocytes,Ua: NEGATIVE
Nitrite: NEGATIVE
Protein, ur: NEGATIVE mg/dL
Specific Gravity, Urine: 1.001 — ABNORMAL LOW (ref 1.005–1.030)
pH: 5 (ref 5.0–8.0)

## 2022-05-27 LAB — URINE DRUG SCREEN, QUALITATIVE (ARMC ONLY)
Amphetamines, Ur Screen: NOT DETECTED
Barbiturates, Ur Screen: NOT DETECTED
Benzodiazepine, Ur Scrn: NOT DETECTED
Cannabinoid 50 Ng, Ur ~~LOC~~: NOT DETECTED
Cocaine Metabolite,Ur ~~LOC~~: NOT DETECTED
MDMA (Ecstasy)Ur Screen: NOT DETECTED
Methadone Scn, Ur: NOT DETECTED
Opiate, Ur Screen: NOT DETECTED
Phencyclidine (PCP) Ur S: NOT DETECTED
Tricyclic, Ur Screen: NOT DETECTED

## 2022-05-27 LAB — SALICYLATE LEVEL: Salicylate Lvl: <7 mg/dL — ABNORMAL LOW (ref 7.0–30.0)

## 2022-05-27 LAB — ACETAMINOPHEN LEVEL: Acetaminophen (Tylenol), Serum: <10 ug/mL — ABNORMAL LOW (ref 10–30)

## 2022-05-27 LAB — ETHANOL: Alcohol, Ethyl (B): 278 mg/dL — ABNORMAL HIGH (ref <10)

## 2022-05-27 MED ORDER — HALOPERIDOL LACTATE 5 MG/ML IJ SOLN
INTRAMUSCULAR | Status: AC
  Administered 2022-05-27: 5 mg via INTRAMUSCULAR
  Filled 2022-05-27: qty 1

## 2022-05-27 MED ORDER — HALOPERIDOL LACTATE 5 MG/ML IJ SOLN: 5.0000 mg | Freq: Once | INTRAMUSCULAR | Status: AC

## 2022-05-27 MED ORDER — SODIUM CHLORIDE 0.9 % IV BOLUS
1000.0000 mL | Freq: Once | INTRAVENOUS | Status: AC
  Administered 2022-05-27: 1000 mL via INTRAVENOUS

## 2022-05-27 MED ORDER — ZIPRASIDONE MESYLATE 20 MG IM SOLR
INTRAMUSCULAR | Status: AC
  Filled 2022-05-27: qty 20

## 2022-05-27 MED ORDER — LORAZEPAM 2 MG/ML IJ SOLN
INTRAMUSCULAR | Status: AC
  Administered 2022-05-27: 2 mg via INTRAMUSCULAR
  Filled 2022-05-27: qty 1

## 2022-05-27 MED ORDER — LORAZEPAM 2 MG/ML IJ SOLN: 2.0000 mg | Freq: Once | INTRAMUSCULAR | Status: AC

## 2022-05-27 MED ORDER — DIPHENHYDRAMINE HCL 50 MG/ML IJ SOLN: 50.0000 mg | Freq: Once | INTRAMUSCULAR | Status: AC

## 2022-05-27 MED ORDER — DIPHENHYDRAMINE HCL 50 MG/ML IJ SOLN
INTRAMUSCULAR | Status: AC
  Administered 2022-05-27: 50 mg via INTRAMUSCULAR
  Filled 2022-05-27: qty 1

## 2022-05-27 MED ORDER — ZIPRASIDONE MESYLATE 20 MG IM SOLR: 10.0000 mg | Freq: Once | INTRAMUSCULAR | Status: DC

## 2022-05-27 NOTE — ED Notes (Signed)
Up with no assist, pt alert and oriented. Mother at bedside.
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
## 2022-05-27 NOTE — ED Notes (Signed)
Pt started on fluids and adjusted in bed. Pt will not keep arm unclinched and will not lay on back. Pt moved up in bed by self and fellow nurse.

## 2022-05-27 NOTE — ED Notes (Signed)
Pt up to use the restroom with assistance from staff. Pt was appropriate in communication and climbed back on stretcher to go back to sleep. Pt's mother given a recliner and blankets. Pt given 2 more blankets.

## 2022-05-27 NOTE — ED Notes (Signed)
Pt's mother back at bedside. Pt is resting. Provider at bedside.
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
## 2022-05-27 NOTE — Discharge Instructions (Signed)
Drink alcohol only in moderation.  Eat bananas and green leafy vegetables to supplement your potassium levels.  Return to the ER for worsening symptoms, persistent vomiting, difficulty breathing or other concerns.

## 2022-06-20 ENCOUNTER — Ambulatory Visit: Payer: Self-pay

## 2022-06-20 ENCOUNTER — Ambulatory Visit: Payer: Self-pay | Admitting: Physician Assistant

## 2022-06-20 ENCOUNTER — Encounter: Payer: Self-pay | Admitting: Physician Assistant

## 2022-06-20 VITALS — BP 114/72 | HR 58 | Temp 98.2°F | Resp 12 | Ht 61.0 in | Wt 130.0 lb

## 2022-06-20 DIAGNOSIS — Z021 Encounter for pre-employment examination: Secondary | ICD-10-CM

## 2022-06-20 LAB — POCT URINALYSIS DIPSTICK
Bilirubin, UA: NEGATIVE
Blood, UA: NEGATIVE
Glucose, UA: NEGATIVE
Ketones, UA: NEGATIVE
Leukocytes, UA: NEGATIVE
Nitrite, UA: NEGATIVE
Protein, UA: NEGATIVE
Spec Grav, UA: 1.015 (ref 1.010–1.025)
Urobilinogen, UA: 0.2 E.U./dL
pH, UA: 7 (ref 5.0–8.0)

## 2022-06-20 NOTE — Progress Notes (Signed)
Pt presents today to complete [employer]. No concerns or issues at this time/CL,RMA

## 2022-06-20 NOTE — Progress Notes (Signed)
[facility]  ____________________________________________   None    (approximate)  I have reviewed the triage vital signs and the nursing notes.   HISTORY  Chief Complaint No chief complaint on file.   HPI Sara Johnston is a 26 y.o. 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  Patient voices no concerns or complaints.        History reviewed. No pertinent past medical history.  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 Authorizing Provider  albuterol (VENTOLIN HFA) 108 (90 Base) MCG/ACT inhaler Inhale into the lungs. 08/05/21 08/05/22 Yes [provider]  amLODipine (NORVASC) 5 MG tablet Take 1 tablet by mouth daily. 10/14/21 10/14/22 Yes [provider]  FLUoxetine (PROZAC) 40 MG capsule Take 40 mg by mouth every morning. 06/12/22  Yes [provider]  hydrOXYzine (VISTARIL) 25 MG capsule Take 25 mg by mouth every 6 (six) hours as needed. 02/21/22  Yes [provider]  medroxyPROGESTERone (DEPO-PROVERA) 150 MG/ML injection Inject into the muscle. 09/16/21 09/11/22 Yes [provider]   prazosin (MINIPRESS) 1 MG capsule Take 1 mg by mouth at bedtime. 06/04/22  Yes [provider]  propranolol (INDERAL) 20 MG tablet Take 20 mg by mouth every morning. 06/08/22  Yes [provider]  verapamil (CALAN-SR) 240 MG CR tablet Take 240 mg by mouth daily. 03/25/22  Yes [provider]    Allergies Amoxicillin  Family History  Family history unknown: Yes    Social History Social History   Tobacco Use   Smoking status: Never   Smokeless tobacco: Never  Substance Use Topics   Alcohol use: No   Drug use: No    Review of Systems Constitutional: No fever/chills Eyes: No visual changes. ENT: No sore throat. Cardiovascular: Denies chest pain. Respiratory: Denies shortness of breath. Gastrointestinal: No abdominal pain.  No nausea, no vomiting.  No diarrhea.  No constipation. Genitourinary: Negative for dysuria. Musculoskeletal: Negative for back pain. Skin: Negative for rash. Neurological: Negative for headaches, focal weakness or numbness. Psychiatric: ADHD and anxiety Endocrine: Hypertension Allergic/Immunilogical: Amoxicillin ____________________________________________   PHYSICAL EXAM:  VITAL SIGNS: BP is 114/72, pulse 58, respiration 12, temperature is 98.2, patient is 99% O2 sat on room air.  Patient weighs 130 pounds and BMI is 24.56. Constitutional: Alert and oriented. Well appearing and in no acute distress. Eyes: Conjunctivae are normal. PERRL. EOMI. Head: Atraumatic. Nose: No congestion/rhinnorhea. Mouth/Throat: Mucous membranes are moist.  Oropharynx non-erythematous. Neck: No stridor.  No cervical spine tenderness to palpation. Hematological/Lymphatic/Immunilogical: No cervical lymphadenopathy. Cardiovascular: Bradycardic, regular rhythm. Grossly normal heart sounds.  Good peripheral circulation. Respiratory: Normal respiratory effort.  No retractions. Lungs CTAB. Gastrointestinal: Soft and nontender. No distention.  No  abdominal bruits. No CVA tenderness. Genitourinary: Deferred Musculoskeletal: No lower extremity tenderness nor edema.  No joint effusions. Neurologic:  Normal speech and language. No gross focal neurologic deficits are appreciated. No gait instability. Skin:  Skin is warm, dry and intact. No rash noted. Psychiatric: Mood and affect are normal. Speech and behavior are normal.  ____________________________________________   LABS _Pending ___________________________________________  EKG  Marked sinus bradycardia 47 bpm ____________________________________________    ____________________________________________   INITIAL IMPRESSION / ASSESSMENT AND PLAN  As part of my medical decision making, I reviewed the following data within the electronic MEDICAL RECORD NUMBER       Discussed no acute findings on physical exam.  Advise labs are pending.     ____________________________________________   FINAL CLINICAL IMPRESSION(S) / ED DIAGNOSES  Well exam pending lab results.   ED Discharge Orders     None        Note:  This document was prepared using Dragon voice recognition software and may include unintentional dictation errors.

## 2022-06-20 NOTE — Progress Notes (Signed)
Pt presents today to complete [employer] pre-employment physical.

## 2022-06-23 ENCOUNTER — Ambulatory Visit: Payer: Self-pay

## 2022-06-23 DIAGNOSIS — Z021 Encounter for pre-employment examination: Secondary | ICD-10-CM

## 2022-06-23 DIAGNOSIS — Z0000 Encounter for general adult medical examination without abnormal findings: Secondary | ICD-10-CM

## 2022-06-23 LAB — CMP12+LP+TP+TSH+6AC+CBC/D/PLT
ALT: 14 IU/L (ref 0–32)
AST: 19 IU/L (ref 0–40)
Albumin/Globulin Ratio: 1.5 (ref 1.2–2.2)
Albumin: 4.3 g/dL (ref 4.0–5.0)
Alkaline Phosphatase: 102 IU/L (ref 44–121)
BUN/Creatinine Ratio: 10 (ref 9–23)
BUN: 10 mg/dL (ref 6–20)
Basophils Absolute: 0.1 10*3/uL (ref 0.0–0.2)
Basos: 1 %
Bilirubin Total: <0.2 mg/dL (ref 0.0–1.2)
Calcium: 9 mg/dL (ref 8.7–10.2)
Chloride: 104 mmol/L (ref 96–106)
Chol/HDL Ratio: 2.4 ratio (ref 0.0–4.4)
Cholesterol, Total: 161 mg/dL (ref 100–199)
Creatinine, Ser: 0.97 mg/dL (ref 0.57–1.00)
EOS (ABSOLUTE): 0.2 10*3/uL (ref 0.0–0.4)
Eos: 3 %
Estimated CHD Risk: <0.5 times avg. (ref 0.0–1.0)
Free Thyroxine Index: 2.1 (ref 1.2–4.9)
GGT: 33 IU/L (ref 0–60)
Globulin, Total: 2.8 g/dL (ref 1.5–4.5)
Glucose: 88 mg/dL (ref 70–99)
HDL: 66 mg/dL (ref >39)
Hematocrit: 37.9 % (ref 34.0–46.6)
Hemoglobin: 12.7 g/dL (ref 11.1–15.9)
Immature Grans (Abs): 0 10*3/uL (ref 0.0–0.1)
Immature Granulocytes: 0 %
Iron: 17 ug/dL — ABNORMAL LOW (ref 27–159)
LDH: 189 IU/L (ref 119–226)
LDL Chol Calc (NIH): 80 mg/dL (ref 0–99)
Lymphocytes Absolute: 3 10*3/uL (ref 0.7–3.1)
Lymphs: 36 %
MCH: 25.9 pg — ABNORMAL LOW (ref 26.6–33.0)
MCHC: 33.5 g/dL (ref 31.5–35.7)
MCV: 77 fL — ABNORMAL LOW (ref 79–97)
Monocytes Absolute: 0.6 10*3/uL (ref 0.1–0.9)
Monocytes: 8 %
Neutrophils Absolute: 4.4 10*3/uL (ref 1.4–7.0)
Neutrophils: 52 %
Phosphorus: 3.5 mg/dL (ref 3.0–4.3)
Platelets: 330 10*3/uL (ref 150–450)
Potassium: 4.2 mmol/L (ref 3.5–5.2)
RBC: 4.9 x10E6/uL (ref 3.77–5.28)
RDW: 13.7 % (ref 11.7–15.4)
Sodium: 139 mmol/L (ref 134–144)
T3 Uptake Ratio: 29 % (ref 24–39)
T4, Total: 7.1 ug/dL (ref 4.5–12.0)
TSH: 1.45 u[IU]/mL (ref 0.450–4.500)
Total Protein: 7.1 g/dL (ref 6.0–8.5)
Triglycerides: 81 mg/dL (ref 0–149)
Uric Acid: 3.6 mg/dL (ref 2.6–6.2)
VLDL Cholesterol Cal: 15 mg/dL (ref 5–40)
WBC: 8.3 10*3/uL (ref 3.4–10.8)
eGFR: 83 mL/min/{1.73_m2} (ref >59)

## 2022-06-23 LAB — QUANTIFERON-TB GOLD PLUS
QuantiFERON Mitogen Value: >10 IU/mL
QuantiFERON Nil Value: 0 IU/mL
QuantiFERON TB1 Ag Value: 0 IU/mL
QuantiFERON TB2 Ag Value: 0 IU/mL
QuantiFERON-TB Gold Plus: NEGATIVE

## 2022-06-23 LAB — HEPATITIS B SURFACE ANTIBODY,QUALITATIVE: Hep B Surface Ab, Qual: REACTIVE

## 2022-06-23 NOTE — Addendum Note (Signed)
Addended by: Aliene Altes on: 06/23/2022 10:27 AM   Modules accepted: Orders

## 2023-02-08 DIAGNOSIS — Z01419 Encounter for gynecological examination (general) (routine) without abnormal findings: Secondary | ICD-10-CM | POA: Diagnosis not present

## 2023-02-08 DIAGNOSIS — J0100 Acute maxillary sinusitis, unspecified: Secondary | ICD-10-CM | POA: Diagnosis not present

## 2023-02-08 DIAGNOSIS — Z124 Encounter for screening for malignant neoplasm of cervix: Secondary | ICD-10-CM | POA: Diagnosis not present

## 2023-02-08 DIAGNOSIS — J4520 Mild intermittent asthma, uncomplicated: Secondary | ICD-10-CM | POA: Diagnosis not present

## 2023-02-08 DIAGNOSIS — Z30430 Encounter for insertion of intrauterine contraceptive device: Secondary | ICD-10-CM | POA: Diagnosis not present

## 2023-02-08 DIAGNOSIS — Z113 Encounter for screening for infections with a predominantly sexual mode of transmission: Secondary | ICD-10-CM | POA: Diagnosis not present

## 2023-03-27 ENCOUNTER — Other Ambulatory Visit: Payer: Self-pay

## 2023-03-27 DIAGNOSIS — Z0283 Encounter for blood-alcohol and blood-drug test: Secondary | ICD-10-CM

## 2023-03-27 NOTE — Progress Notes (Signed)
Pt presents today to complete Random UDS and ETOH. ETOH CLEARED. UDS PENDING WITH [facility].

## 2023-04-24 ENCOUNTER — Ambulatory Visit: Payer: Self-pay

## 2023-04-24 DIAGNOSIS — Z0289 Encounter for other administrative examinations: Secondary | ICD-10-CM

## 2023-04-24 LAB — POCT URINALYSIS DIPSTICK
Bilirubin, UA: NEGATIVE
Blood, UA: NEGATIVE
Glucose, UA: NEGATIVE
Ketones, UA: NEGATIVE
Leukocytes, UA: NEGATIVE
Nitrite, UA: NEGATIVE
Protein, UA: NEGATIVE
Spec Grav, UA: 1.015 (ref 1.010–1.025)
Urobilinogen, UA: 0.2 U/dL
pH, UA: 6 (ref 5.0–8.0)

## 2023-04-24 NOTE — Progress Notes (Signed)
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 Sara Johnston

## 2023-04-25 LAB — CMP12+LP+TP+TSH+6AC+CBC/D/PLT
ALT: 9 IU/L (ref 0–32)
AST: 18 IU/L (ref 0–40)
Albumin: 4.4 g/dL (ref 4.0–5.0)
Alkaline Phosphatase: 98 IU/L (ref 44–121)
BUN/Creatinine Ratio: 14 (ref 9–23)
BUN: 12 mg/dL (ref 6–20)
Basophils Absolute: 0 10*3/uL (ref 0.0–0.2)
Basos: 1 %
Bilirubin Total: 0.3 mg/dL (ref 0.0–1.2)
Calcium: 9.3 mg/dL (ref 8.7–10.2)
Chloride: 102 mmol/L (ref 96–106)
Chol/HDL Ratio: 2.5 ratio (ref 0.0–4.4)
Cholesterol, Total: 228 mg/dL — ABNORMAL HIGH (ref 100–199)
Creatinine, Ser: 0.84 mg/dL (ref 0.57–1.00)
EOS (ABSOLUTE): 0.1 10*3/uL (ref 0.0–0.4)
Eos: 1 %
Estimated CHD Risk: <0.5 times avg. (ref 0.0–1.0)
Free Thyroxine Index: 2.2 (ref 1.2–4.9)
GGT: 18 IU/L (ref 0–60)
Globulin, Total: 2.5 g/dL (ref 1.5–4.5)
Glucose: 82 mg/dL (ref 70–99)
HDL: 91 mg/dL (ref >39)
Hematocrit: 45.5 % (ref 34.0–46.6)
Hemoglobin: 14.9 g/dL (ref 11.1–15.9)
Immature Grans (Abs): 0 10*3/uL (ref 0.0–0.1)
Immature Granulocytes: 0 %
Iron: 92 ug/dL (ref 27–159)
LDH: 166 IU/L (ref 119–226)
LDL Chol Calc (NIH): 124 mg/dL — ABNORMAL HIGH (ref 0–99)
Lymphocytes Absolute: 2.8 10*3/uL (ref 0.7–3.1)
Lymphs: 35 %
MCH: 28.4 pg (ref 26.6–33.0)
MCHC: 32.7 g/dL (ref 31.5–35.7)
MCV: 87 fL (ref 79–97)
Monocytes Absolute: 0.7 10*3/uL (ref 0.1–0.9)
Monocytes: 8 %
Neutrophils Absolute: 4.5 10*3/uL (ref 1.4–7.0)
Neutrophils: 55 %
Phosphorus: 4.2 mg/dL (ref 3.0–4.3)
Platelets: 249 10*3/uL (ref 150–450)
Potassium: 4.3 mmol/L (ref 3.5–5.2)
RBC: 5.25 x10E6/uL (ref 3.77–5.28)
RDW: 12.5 % (ref 11.7–15.4)
Sodium: 139 mmol/L (ref 134–144)
T3 Uptake Ratio: 29 % (ref 24–39)
T4, Total: 7.6 ug/dL (ref 4.5–12.0)
TSH: 2.5 u[IU]/mL (ref 0.450–4.500)
Total Protein: 6.9 g/dL (ref 6.0–8.5)
Triglycerides: 76 mg/dL (ref 0–149)
Uric Acid: 4.9 mg/dL (ref 2.6–6.2)
VLDL Cholesterol Cal: 13 mg/dL (ref 5–40)
WBC: 8.2 10*3/uL (ref 3.4–10.8)
eGFR: 99 mL/min/{1.73_m2} (ref >59)

## 2023-04-30 ENCOUNTER — Ambulatory Visit: Payer: Self-pay | Admitting: Physician Assistant

## 2023-04-30 ENCOUNTER — Encounter: Payer: Self-pay | Admitting: Physician Assistant

## 2023-04-30 VITALS — BP 138/80 | HR 72 | Temp 97.8°F | Resp 14 | Ht 61.0 in | Wt 157.0 lb

## 2023-04-30 DIAGNOSIS — Z0000 Encounter for general adult medical examination without abnormal findings: Secondary | ICD-10-CM

## 2023-04-30 NOTE — Progress Notes (Signed)
Here for yearly physical with [facility].  Stated also works with [employer] and sees a PCP with med management.  No new complaints voiced.

## 2023-04-30 NOTE — Progress Notes (Signed)
[facility] [place] ____________________________________________   None    (approximate)  I have reviewed the triage vital signs and the nursing notes.   HISTORY  Chief Complaint No chief complaint on file.  HPI Sara Johnston is a 27 y.o. female         No past medical history on file.  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 Authorizing Provider  albuterol (VENTOLIN HFA) 108 (90 Base) MCG/ACT inhaler Inhale into the lungs. 08/05/21 08/05/22  [provider]  amLODipine (NORVASC) 5 MG tablet Take 1 tablet by mouth daily. 10/14/21 10/14/22  [provider]  FLUoxetine (PROZAC) 40 MG capsule Take 40 mg by mouth every morning. 06/12/22   [provider]  hydrOXYzine (VISTARIL) 25 MG capsule Take 25 mg by mouth every 6 (six) hours as needed. 02/21/22   [provider]  medroxyPROGESTERone (DEPO-PROVERA) 150 MG/ML injection Inject into the muscle. 09/16/21 09/11/22  [provider]  prazosin (MINIPRESS) 1 MG capsule Take 1 mg by mouth at bedtime. 06/04/22   [provider]  propranolol (INDERAL) 20 MG tablet Take 20 mg by  mouth every morning. 06/08/22   [provider]  verapamil (CALAN-SR) 240 MG CR tablet Take 240 mg by mouth daily. 03/25/22   [provider]    Allergies Amoxicillin  Family History  Family history unknown: Yes    Social History Social History   Tobacco Use   Smoking status: Never   Smokeless tobacco: Never  Substance Use Topics   Alcohol use: No   Drug use: No    Review of Systems Constitutional: No fever/chills Eyes: No visual changes. ENT: No sore throat. Cardiovascular: Denies chest pain. Respiratory: Denies shortness of breath. Gastrointestinal: No abdominal pain.  No nausea, no vomiting.  No diarrhea.  No constipation. Genitourinary: Negative for dysuria. Musculoskeletal: Negative for back pain. Skin: Negative for rash. Neurological: Negative for headaches, focal weakness or numbness. Psychiatric: ADHD and anxiety Endocrine: Hypertension  Allergic/Immunilogical: Amoxicillin  ____________________________________________   PHYSICAL EXAM:  VITAL SIGNS:  Constitutional: Alert and oriented. Well appearing and in no acute distress. Eyes: Conjunctivae are normal. PERRL. EOMI. Head: Atraumatic. Nose: No congestion/rhinnorhea. Mouth/Throat: Mucous membranes are moist.  Oropharynx non-erythematous. Neck: No stridor.  No cervical spine tenderness to palpation. Hematological/Lymphatic/Immunilogical: No cervical lymphadenopathy. Cardiovascular: Normal rate, regular rhythm. Grossly normal heart sounds.  Good peripheral circulation. Respiratory: Normal respiratory effort.  No retractions. Lungs CTAB. Gastrointestinal: Soft and nontender. No distention. No abdominal bruits. No CVA tenderness. Genitourinary: Deferred Musculoskeletal: No lower extremity tenderness nor edema.  No joint effusions. Neurologic:  Normal speech and language. No gross focal neurologic deficits are appreciated. No gait instability. Skin:  Skin is warm, dry  and intact. No rash  noted. Psychiatric: Mood and affect are normal. Speech and behavior are normal.  ____________________________________________   LABS       Component Ref Range & Units 6 d ago 10 mo ago 11 mo ago  Color, UA yellow yellow   Clarity, UA cloudy clear   Glucose, UA Negative Negative Negative   Bilirubin, UA neg neg   Ketones, UA neg neg   Spec Grav, UA 1.010 - 1.025 1.015 1.015   Blood, UA neg negative   pH, UA 5.0 - 8.0 6.0 7.0   Protein, UA Negative Negative Negative   Urobilinogen, UA 0.2 or 1.0 E.U./dL 0.2 0.2   Nitrite, UA neg neg   Leukocytes, UA Negative Negative Negative   Appearance   CLEAR Abnormal  R  Odor     Resulting Agency   [facility]              View All Conversations on this Encounter                  Component Ref Range & Units 6 d ago (04/24/23) 10 mo ago (06/20/22) 11 mo ago (05/26/22) 11 mo ago (05/26/22) 3 yr ago (06/09/19) 3 yr ago (06/09/19) 11 yr ago (01/09/12) 11 yr ago (01/09/12)  Glucose 70 - 99 mg/dL 82 88 88 CM  191 High   96 R   Uric Acid 2.6 - 6.2 mg/dL 4.9 3.6 CM        Comment:            Therapeutic target for gout patients: <6.0  BUN 6 - 20 mg/dL 12 10 11  10  7  Low  R   Creatinine, Ser 0.57 - 1.00 mg/dL 4.78 2.95 6.21 High  R  0.80 R  0.82 R   eGFR >59 mL/min/1.73 99 83        BUN/Creatinine Ratio 9 - 23 14 10         Sodium 134 - 144 mmol/L 139 139 139 R  137 R  140 R   Potassium 3.5 - 5.2 mmol/L 4.3 4.2 3.1 Low  R  4.2 R  3.6 R   Chloride 96 - 106 mmol/L 102 104 106 R  104 R  103 R   Calcium 8.7 - 10.2 mg/dL 9.3 9.0 8.8 Low  R  9.1 R  9.3 R   Phosphorus 3.0 - 4.3 mg/dL 4.2 3.5        Total Protein 6.0 - 8.5 g/dL 6.9 7.1 7.7 R  7.3 R  8.4 R   Albumin 4.0 - 5.0 g/dL 4.4 4.3 4.2 R  3.8 R  4.7 R   Globulin, Total 1.5 - 4.5 g/dL 2.5 2.8        Bilirubin Total 0.0 - 1.2 mg/dL 0.3 <3.0 0.4 R  0.4 R  0.4 R   Alkaline Phosphatase 44 - 121 IU/L 98 102 93 R  53 R  108 R   LDH 119 - 226 IU/L 166 189        AST 0  - 40 IU/L 18 19 25  R  17 R  21 R   ALT 0 - 32 IU/L 9 14 18  R  10 R  15 R, CM   GGT 0 - 60 IU/L 18 33        Iron 27 - 159 ug/dL 92 17 Low         Cholesterol, Total 100 - 199 mg/dL 865 High  161        Triglycerides 0 - 149 mg/dL 76 81        HDL >08 mg/dL 91 66        VLDL Cholesterol Cal 5 - 40 mg/dL 13 15        LDL Chol Calc (NIH) 0 - 99 mg/dL 657 High  80        Chol/HDL Ratio 0.0 - 4.4 ratio 2.5 2.4 CM        Comment:                                   T. Chol/HDL Ratio                                             Men  Women                               1/2 Avg.Risk  3.4    3.3                                   Avg.Risk  5.0    4.4                                2X Avg.Risk  9.6    7.1                                3X Avg.Risk 23.4   11.0  Estimated CHD Risk 0.0 - 1.0 times avg.  < 0.5  < 0.5 CM        Comment: The CHD Risk is based on the T. Chol/HDL ratio. Other factors affect CHD Risk such as hypertension, smoking, diabetes, severe obesity, and family history of premature CHD.  TSH 0.450 - 4.500 uIU/mL 2.500 1.450        T4, Total 4.5 - 12.0 ug/dL 7.6 7.1        T3 Uptake Ratio 24 - 39 % 29 29        Free Thyroxine Index 1.2 - 4.9 2.2 2.1        WBC 3.4 - 10.8 x10E3/uL 8.2 8.3  10.2 R  9.5 R  12.6 High  R  RBC 3.77 - 5.28 x10E6/uL 5.25 4.90  5.00 R  5.00 R  5.16 R  Hemoglobin 11.1 - 15.9 g/dL 84.6 96.2  95.2 R  84.1 R    Hematocrit 34.0 - 46.6 % 45.5 37.9  38.4 R  40.0 R    MCV 79 - 97 fL 87 77 Low   76.8 Low  R  80.0 R  84 R  MCH 26.6 - 33.0 pg 28.4 25.9 Low   25.4 Low  R  27.0 R  27.8 R  MCHC 31.5 - 35.7 g/dL 32.4 40.1  02.7 R  25.3 R  33.0 R  RDW 11.7 - 15.4 % 12.5 13.7  13.5 R  13.6 R  13.6 R  Platelets 150 - 450 x10E3/uL 249 330  319 R  299 R  273 R  Neutrophils  Not Estab. % 55 52        Lymphs Not Estab. % 35 36        Monocytes Not Estab. % 8 8        Eos Not Estab. % 1 3        Basos Not Estab. % 1 1        Neutrophils Absolute 1.4  - 7.0 x10E3/uL 4.5 4.4        Lymphocytes Absolute 0.7 - 3.1 x10E3/uL 2.8 3.0        Monocytes Absolute 0.1 - 0.9 x10E3/uL 0.7 0.6        EOS (ABSOLUTE) 0.0 - 0.4 x10E3/uL 0.1 0.2        Basophils Absolute 0.0 - 0.2 x10E3/uL 0.0 0.1        Immature Granulocytes Not Estab. % 0 0        Immature Grans (Abs) 0.0 - 0.1               ____________________________________________  EKG  Sinus rhythm at 68 bpm ____________________________________________    ____________________________________________   INITIAL IMPRESSION / ASSESSMENT AND PLAN  As part of my medical decision making, I reviewed the following data within the electronic MEDICAL RECORD NUMBER      No acute findings on physical exam, EKG, and labs.        ____________________________________________   FINAL CLINICAL IMPRESSION Well exam    ED Discharge Orders     None        Note:  This document was prepared using Dragon voice recognition software and may include unintentional dictation errors.

## 2023-06-26 DIAGNOSIS — J209 Acute bronchitis, unspecified: Secondary | ICD-10-CM | POA: Diagnosis not present

## 2023-07-11 DIAGNOSIS — R87610 Atypical squamous cells of undetermined significance on cytologic smear of cervix (ASC-US): Secondary | ICD-10-CM | POA: Diagnosis not present

## 2023-07-11 DIAGNOSIS — R87618 Other abnormal cytological findings on specimens from cervix uteri: Secondary | ICD-10-CM | POA: Diagnosis not present

## 2023-07-11 DIAGNOSIS — R87810 Cervical high risk human papillomavirus (HPV) DNA test positive: Secondary | ICD-10-CM | POA: Diagnosis not present

## 2023-07-29 DIAGNOSIS — J029 Acute pharyngitis, unspecified: Secondary | ICD-10-CM | POA: Diagnosis not present

## 2023-10-17 ENCOUNTER — Other Ambulatory Visit: Payer: Self-pay

## 2023-10-17 DIAGNOSIS — Z0283 Encounter for blood-alcohol and blood-drug test: Secondary | ICD-10-CM

## 2023-10-17 NOTE — Progress Notes (Signed)
 Pt completed Random UDS & ETOH.

## 2023-10-17 NOTE — Progress Notes (Signed)
 Presents to [facility] for safety sensitive random drug screen & breath alcohol screen.  [facility] Acct #:  [id number] [facility] Specimen #:  [id number]  Breath Alcohol administered onsite using [employer] Breathalyzer.

## 2023-10-22 DIAGNOSIS — L509 Urticaria, unspecified: Secondary | ICD-10-CM | POA: Diagnosis not present

## 2023-10-22 DIAGNOSIS — R1013 Epigastric pain: Secondary | ICD-10-CM | POA: Diagnosis not present

## 2023-10-22 DIAGNOSIS — R110 Nausea: Secondary | ICD-10-CM | POA: Diagnosis not present

## 2023-10-22 DIAGNOSIS — R197 Diarrhea, unspecified: Secondary | ICD-10-CM | POA: Diagnosis not present

## 2023-10-22 DIAGNOSIS — R220 Localized swelling, mass and lump, head: Secondary | ICD-10-CM | POA: Diagnosis not present

## 2023-12-05 DIAGNOSIS — M79671 Pain in right foot: Secondary | ICD-10-CM | POA: Diagnosis not present

## 2023-12-05 DIAGNOSIS — M25571 Pain in right ankle and joints of right foot: Secondary | ICD-10-CM | POA: Diagnosis not present

## 2023-12-05 DIAGNOSIS — I10 Essential (primary) hypertension: Secondary | ICD-10-CM | POA: Diagnosis not present

## 2023-12-05 DIAGNOSIS — G43809 Other migraine, not intractable, without status migrainosus: Secondary | ICD-10-CM | POA: Diagnosis not present

## 2023-12-20 DIAGNOSIS — M216X1 Other acquired deformities of right foot: Secondary | ICD-10-CM | POA: Diagnosis not present

## 2023-12-20 DIAGNOSIS — M216X2 Other acquired deformities of left foot: Secondary | ICD-10-CM | POA: Diagnosis not present

## 2023-12-20 DIAGNOSIS — M7750 Other enthesopathy of unspecified foot: Secondary | ICD-10-CM | POA: Diagnosis not present

## 2023-12-20 DIAGNOSIS — M2140 Flat foot [pes planus] (acquired), unspecified foot: Secondary | ICD-10-CM | POA: Diagnosis not present

## 2023-12-20 DIAGNOSIS — G5751 Tarsal tunnel syndrome, right lower limb: Secondary | ICD-10-CM | POA: Diagnosis not present

## 2023-12-20 DIAGNOSIS — M25571 Pain in right ankle and joints of right foot: Secondary | ICD-10-CM | POA: Diagnosis not present

## 2023-12-20 DIAGNOSIS — M79671 Pain in right foot: Secondary | ICD-10-CM | POA: Diagnosis not present

## 2023-12-20 DIAGNOSIS — R269 Unspecified abnormalities of gait and mobility: Secondary | ICD-10-CM | POA: Diagnosis not present

## 2023-12-21 DIAGNOSIS — R635 Abnormal weight gain: Secondary | ICD-10-CM | POA: Diagnosis not present

## 2023-12-21 DIAGNOSIS — Z1159 Encounter for screening for other viral diseases: Secondary | ICD-10-CM | POA: Diagnosis not present

## 2023-12-21 DIAGNOSIS — R5383 Other fatigue: Secondary | ICD-10-CM | POA: Diagnosis not present

## 2023-12-21 DIAGNOSIS — R0683 Snoring: Secondary | ICD-10-CM | POA: Diagnosis not present

## 2023-12-21 DIAGNOSIS — R400 Somnolence: Secondary | ICD-10-CM | POA: Diagnosis not present

## 2024-01-08 DIAGNOSIS — R110 Nausea: Secondary | ICD-10-CM | POA: Diagnosis not present

## 2024-01-08 DIAGNOSIS — R0683 Snoring: Secondary | ICD-10-CM | POA: Diagnosis not present

## 2024-01-08 DIAGNOSIS — R635 Abnormal weight gain: Secondary | ICD-10-CM | POA: Diagnosis not present

## 2024-01-08 DIAGNOSIS — R5383 Other fatigue: Secondary | ICD-10-CM | POA: Diagnosis not present

## 2024-01-08 DIAGNOSIS — F411 Generalized anxiety disorder: Secondary | ICD-10-CM | POA: Diagnosis not present

## 2024-01-08 DIAGNOSIS — J209 Acute bronchitis, unspecified: Secondary | ICD-10-CM | POA: Diagnosis not present

## 2024-01-08 DIAGNOSIS — Z113 Encounter for screening for infections with a predominantly sexual mode of transmission: Secondary | ICD-10-CM | POA: Diagnosis not present

## 2024-01-09 DIAGNOSIS — R269 Unspecified abnormalities of gait and mobility: Secondary | ICD-10-CM | POA: Diagnosis not present

## 2024-03-04 DIAGNOSIS — E669 Obesity, unspecified: Secondary | ICD-10-CM | POA: Diagnosis not present

## 2024-03-04 DIAGNOSIS — Z01419 Encounter for gynecological examination (general) (routine) without abnormal findings: Secondary | ICD-10-CM | POA: Diagnosis not present

## 2024-03-04 DIAGNOSIS — Z124 Encounter for screening for malignant neoplasm of cervix: Secondary | ICD-10-CM | POA: Diagnosis not present

## 2024-03-05 DIAGNOSIS — R87612 Low grade squamous intraepithelial lesion on cytologic smear of cervix (LGSIL): Secondary | ICD-10-CM | POA: Diagnosis not present

## 2024-04-23 ENCOUNTER — Other Ambulatory Visit: Payer: Self-pay

## 2024-04-23 NOTE — Progress Notes (Signed)
 Random BAT completed and cleared after consent signed per [facility] protocol due to not being completed at the random completed at [facility] this quarter on random list.

## 2024-04-30 ENCOUNTER — Ambulatory Visit: Payer: Self-pay

## 2024-04-30 DIAGNOSIS — Z0289 Encounter for other administrative examinations: Secondary | ICD-10-CM

## 2024-04-30 LAB — POCT URINALYSIS DIPSTICK
Bilirubin, UA: NEGATIVE
Blood, UA: NEGATIVE
Glucose, UA: NEGATIVE
Ketones, UA: NEGATIVE
Leukocytes, UA: NEGATIVE
Nitrite, UA: NEGATIVE
Protein, UA: NEGATIVE
Spec Grav, UA: 1.01 (ref 1.010–1.025)
Urobilinogen, UA: 0.2 U/dL
pH, UA: 6.5 (ref 5.0–8.0)

## 2024-05-01 LAB — CMP12+LP+TP+TSH+6AC+CBC/D/PLT
ALT: 8 IU/L (ref 0–32)
AST: 14 IU/L (ref 0–40)
Albumin: 4.4 g/dL (ref 4.0–5.0)
Alkaline Phosphatase: 108 IU/L (ref 44–121)
BUN/Creatinine Ratio: 11 (ref 9–23)
BUN: 9 mg/dL (ref 6–20)
Basophils Absolute: 0.1 x10E3/uL (ref 0.0–0.2)
Basos: 1 %
Bilirubin Total: 0.6 mg/dL (ref 0.0–1.2)
Calcium: 9.5 mg/dL (ref 8.7–10.2)
Chloride: 100 mmol/L (ref 96–106)
Chol/HDL Ratio: 2.9 ratio (ref 0.0–4.4)
Cholesterol, Total: 208 mg/dL — ABNORMAL HIGH (ref 100–199)
Creatinine, Ser: 0.85 mg/dL (ref 0.57–1.00)
EOS (ABSOLUTE): 0.1 x10E3/uL (ref 0.0–0.4)
Eos: 1 %
Estimated CHD Risk: <0.5 times avg. (ref 0.0–1.0)
Free Thyroxine Index: 2.5 (ref 1.2–4.9)
GGT: 19 IU/L (ref 0–60)
Globulin, Total: 2.5 g/dL (ref 1.5–4.5)
Glucose: 79 mg/dL (ref 70–99)
HDL: 71 mg/dL (ref >39)
Hematocrit: 45.3 % (ref 34.0–46.6)
Hemoglobin: 14.9 g/dL (ref 11.1–15.9)
Immature Grans (Abs): 0 x10E3/uL (ref 0.0–0.1)
Immature Granulocytes: 0 %
Iron: 152 ug/dL (ref 27–159)
LDH: 177 IU/L (ref 119–226)
LDL Chol Calc (NIH): 117 mg/dL — ABNORMAL HIGH (ref 0–99)
Lymphocytes Absolute: 2 x10E3/uL (ref 0.7–3.1)
Lymphs: 24 %
MCH: 29.2 pg (ref 26.6–33.0)
MCHC: 32.9 g/dL (ref 31.5–35.7)
MCV: 89 fL (ref 79–97)
Monocytes Absolute: 0.7 x10E3/uL (ref 0.1–0.9)
Monocytes: 8 %
Neutrophils Absolute: 5.3 x10E3/uL (ref 1.4–7.0)
Neutrophils: 66 %
Phosphorus: 3.2 mg/dL (ref 3.0–4.3)
Platelets: 300 x10E3/uL (ref 150–450)
Potassium: 4.6 mmol/L (ref 3.5–5.2)
RBC: 5.1 x10E6/uL (ref 3.77–5.28)
RDW: 12.7 % (ref 11.7–15.4)
Sodium: 138 mmol/L (ref 134–144)
T3 Uptake Ratio: 29 % (ref 24–39)
T4, Total: 8.5 ug/dL (ref 4.5–12.0)
TSH: 2 u[IU]/mL (ref 0.450–4.500)
Total Protein: 6.9 g/dL (ref 6.0–8.5)
Triglycerides: 116 mg/dL (ref 0–149)
Uric Acid: 4.9 mg/dL (ref 2.6–6.2)
VLDL Cholesterol Cal: 20 mg/dL (ref 5–40)
WBC: 8.1 x10E3/uL (ref 3.4–10.8)
eGFR: 97 mL/min/1.73 (ref >59)

## 2024-05-06 ENCOUNTER — Ambulatory Visit: Payer: Self-pay | Admitting: Physician Assistant

## 2024-05-06 ENCOUNTER — Encounter: Payer: Self-pay | Admitting: Physician Assistant

## 2024-05-06 VITALS — BP 137/87 | HR 72 | Temp 97.6°F | Resp 16 | Ht 61.0 in | Wt 165.0 lb

## 2024-05-06 DIAGNOSIS — Z0000 Encounter for general adult medical examination without abnormal findings: Secondary | ICD-10-CM

## 2024-05-06 MED ORDER — HYDROXYZINE PAMOATE 25 MG PO CAPS: 25.0000 mg | ORAL_CAPSULE | Freq: Every evening | ORAL | 0 refills | Status: DC

## 2024-05-06 NOTE — Progress Notes (Signed)
 [facility]     ____________________________________________   None    (approximate)  I have reviewed the triage vital signs and the nursing notes.   HISTORY  Chief Complaint No chief complaint on file.   HPI Sara Johnston is a 28 y.o. female patient presents for annual physical exam.  Patient states has trouble falling asleep.        No past medical history on file.  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 Authorizing Provider  albuterol (VENTOLIN HFA) 108 (90 Base) MCG/ACT inhaler Inhale 2 puffs into the lungs every 6 (six) hours as needed for shortness of breath. 08/05/21 08/05/22  [provider]  amLODipine (NORVASC) 5 MG tablet Take 1 tablet by mouth daily. 10/14/21 10/14/22  [provider]  FLUoxetine (PROZAC) 40 MG capsule Take 40 mg by mouth every morning. 06/12/22   [provider]  hydrOXYzine  (VISTARIL ) 25 MG capsule Take 25 mg by mouth every 6 (six) hours as needed. 02/21/22   [provider]  prazosin (MINIPRESS) 1 MG capsule Take 1 mg by mouth at bedtime. 06/04/22   [provider]  propranolol (INDERAL)  20 MG tablet Take 20 mg by mouth every morning. 06/08/22   [provider]  verapamil (CALAN-SR) 240 MG CR tablet Take 240 mg by mouth daily. 03/25/22   [provider]    Allergies Amoxicillin  Family History  Family history unknown: Yes    Social History Social History   Tobacco Use   Smoking status: Never   Smokeless tobacco: Never  Substance Use Topics   Alcohol use: No   Drug use: No    Review of Systems Constitutional: No fever/chills Eyes: No visual changes. ENT: No sore throat. Cardiovascular: Denies chest pain. Respiratory: Denies shortness of breath. Gastrointestinal: No abdominal pain.  No nausea, no vomiting.  No diarrhea.  No constipation. Genitourinary: Negative for dysuria. Musculoskeletal: Negative for back pain. Skin: Negative for rash. Neurological: Negative for headaches, focal weakness or numbness. Psychiatric: Anxiety and whitecoat syndrome Endocrine: Hypertension Hematological/Lymphatic:  Allergic/Immunilogical: Amoxicillin ____________________________________________   PHYSICAL EXAM:  VITAL SIGNS:  Constitutional: Alert and oriented. Well appearing and in no acute distress. Eyes: Conjunctivae are normal. PERRL. EOMI. Head: Atraumatic. BP 144/97 137/87  Cuff Size Normal Large  Pulse Rate 63 72  Temp 97.6 F (36.4 C) --  Temp Source Temporal --  Weight 165 lb (74.8 kg) --  Height 5' 1 (1.549 m) --  Resp 16 --  SpO2 99 % 99 %  Nose: No congestion/rhinnorhea. Mouth/Throat: Mucous membranes are moist.  Oropharynx non-erythematous. Neck: No stridor. No cervical spine tenderness to palpation. Hematological/Lymphatic/Immunilogical: No cervical lymphadenopathy. Cardiovascular: Normal rate, regular rhythm. Grossly normal  heart sounds.  Good peripheral circulation. Respiratory: Normal respiratory effort.  No retractions. Lungs CTAB. Gastrointestinal: Soft and nontender. No distention. No abdominal bruits. No CVA  tenderness. Genitourinary: Deferred Musculoskeletal: No lower extremity tenderness nor edema.  No joint effusions. Neurologic:  Normal speech and language. No gross focal neurologic deficits are appreciated. No gait instability. Skin:  Skin is warm, dry and intact. No rash noted. Psychiatric: Mood and affect are normal. Speech and behavior are normal.  ____________________________________________   LABS        Component Ref Range & Units (hover) 6 d ago (04/30/24) 1 yr ago (04/24/23) 1 yr ago (06/20/22) 1 yr ago (05/26/22)  Color, UA yellow yellow yellow   Clarity, UA cloudy cloudy clear   Glucose, UA Negative Negative Negative   Bilirubin, UA neg neg neg   Ketones, UA neg neg neg   Spec Grav, UA 1.010 1.015 1.015   Blood, UA neg neg negative   pH, UA 6.5 6.0 7.0   Protein, UA Negative Negative Negative   Urobilinogen, UA 0.2 0.2 0.2   Nitrite, UA neg neg neg   Leukocytes, UA Negative Negative Negative   Appearance    CLEAR Abnormal  R  Odor                       Component Ref Range & Units (hover) 6 d ago (04/30/24) 1 yr ago (04/24/23) 1 yr ago (06/20/22) 1 yr ago (05/26/22) 1 yr ago (05/26/22) 4 yr ago (06/09/19) 4 yr ago (06/09/19)  Glucose 79 82 88 88 CM  106 High    Uric Acid 4.9 4.9 CM 3.6 CM      Comment:            Therapeutic target for gout patients: <6.0  BUN 9 12 10 11  10    Creatinine, Ser 0.85 0.84 0.97 1.10 High  R  0.80 R   eGFR 97 99 83      BUN/Creatinine Ratio 11 14 10       Sodium 138 139 139 139 R  137 R   Potassium 4.6 4.3 4.2 3.1 Low  R  4.2 R   Chloride 100 102 104 106 R  104 R   Calcium 9.5 9.3 9.0 8.8 Low  R  9.1 R   Phosphorus 3.2 4.2 3.5      Total Protein 6.9 6.9 7.1 7.7 R  7.3 R   Albumin 4.4 4.4 4.3 4.2 R  3.8 R   Globulin, Total 2.5 2.5 2.8      Bilirubin Total 0.6 0.3 <0.2 0.4 R  0.4 R   Alkaline Phosphatase 108 98 102 93 R  53 R   Comment: **Effective May 05, 2024 Alkaline Phosphatase**   reference interval will be changing  to:              Age                Female          Female           0 -  5 days         47 - 127       47 - 127           6 - 10 days         29 - 242       29 - 242          11 -  20 days        109 - 357      109 - 357          21 - 30 days         94 - 494       94 - 494           1 -  2 months      149 - 539      149 - 539           3 -  6 months      131 - 452      131 - 452           7 - 11 months      117 - 401      117 - 401   12 months -  6 years       158 - 369      158 - 369           7 - 12 years       150 - 409      150 - 409               13 years       156 - 435       78 - 227               14 years       114 - 375       64 - 161               15 years        88 - 279       56 - 134               16 years        74 - 207       51 - 121               17 years        63 - 161       47 - 113          18 - 20 years        51 - 125       42 - 106          21 - 50 years        47 - 123       41 - 116          51 - 80 years        49 - 135       51 - 125              >80 years        48 - 129       48 - 129  LDH 177 166 189      AST 14 18 19 25  R  17 R   ALT 8 9 14 18  R  10 R   GGT 19 18 33      Iron 152 92 17 Low       Cholesterol, Total 208 High  228 High  161      Triglycerides 116 76 81      HDL 71 91 66      VLDL Cholesterol Cal 20 13 15       LDL  Chol Calc (NIH) 117 High  124 High  80      Chol/HDL Ratio 2.9 2.5 CM 2.4 CM      Comment:                                   T. Chol/HDL Ratio                                             Men  Women                               1/2 Avg.Risk  3.4    3.3                                   Avg.Risk  5.0    4.4                                2X Avg.Risk  9.6    7.1                                3X Avg.Risk 23.4   11.0  Estimated CHD Risk  < 0.5  < 0.5 CM  < 0.5 CM      Comment: The CHD Risk is based on the T. Chol/HDL ratio. Other factors affect CHD Risk such as hypertension, smoking, diabetes, severe obesity, and family  history of premature CHD.  TSH 2.000 2.500 1.450      T4, Total 8.5 7.6 7.1      T3 Uptake Ratio 29 29 29       Free Thyroxine Index 2.5 2.2 2.1      WBC 8.1 8.2 8.3  10.2 R  9.5 R  RBC 5.10 5.25 4.90  5.00 R  5.00 R  Hemoglobin 14.9 14.9 12.7  12.7 R  13.5 R  Hematocrit 45.3 45.5 37.9  38.4 R  40.0 R  MCV 89 87 77 Low   76.8 Low  R  80.0 R  MCH 29.2 28.4 25.9 Low   25.4 Low  R  27.0 R  MCHC 32.9 32.7 33.5  33.1 R  33.8 R  RDW 12.7 12.5 13.7  13.5 R  13.6 R  Platelets 300 249 330  319 R  299 R  Neutrophils 66 55 52      Lymphs 24 35 36      Monocytes 8 8 8       Eos 1 1 3       Basos 1 1 1       Neutrophils Absolute 5.3 4.5 4.4      Lymphocytes Absolute 2.0 2.8 3.0      Monocytes Absolute 0.7 0.7 0.6      EOS (ABSOLUTE) 0.1 0.1 0.2      Basophils Absolute 0.1 0.0 0.1      Immature Granulocytes 0 0 0      Immature Grans (Abs) 0.0 0.0 0.0                    ____________________________________________  EKG  Sinus rhythm at 66 bpm ____________________________________________    ____________________________________________   INITIAL IMPRESSION / ASSESSMENT AND PLAN  As part of my medical decision making, I reviewed the following data within the electronic MEDICAL RECORD NUMBER       No acute findings on physical exam, labs, or EKG.      ____________________________________________   FINAL CLINICAL IMPRESSION Well exam   ED Discharge Orders     None        Note:  This document was prepared using Dragon voice recognition software and may include unintentional dictation errors.

## 2024-05-06 NOTE — Progress Notes (Signed)
 Pt presents today to complete FF physical, Pt complains of difficulty sleep not getting rest.

## 2024-05-28 ENCOUNTER — Other Ambulatory Visit: Payer: Self-pay | Admitting: Physician Assistant

## 2024-06-08 ENCOUNTER — Ambulatory Visit: Payer: Self-pay

## 2024-06-08 DIAGNOSIS — J029 Acute pharyngitis, unspecified: Secondary | ICD-10-CM | POA: Diagnosis not present

## 2024-06-08 DIAGNOSIS — B9689 Other specified bacterial agents as the cause of diseases classified elsewhere: Secondary | ICD-10-CM | POA: Diagnosis not present

## 2024-06-08 DIAGNOSIS — J038 Acute tonsillitis due to other specified organisms: Secondary | ICD-10-CM | POA: Diagnosis not present
